# Patient Record
Sex: Female | Born: 1990 | Race: White | Hispanic: No | Marital: Married | Smoking: Never smoker
Health system: Southern US, Community
[De-identification: ages and names within clinical notes are randomized; demographics above are authoritative.]

## PROBLEM LIST (undated history)

## (undated) DIAGNOSIS — F419 Anxiety disorder, unspecified: Secondary | ICD-10-CM

## (undated) DIAGNOSIS — E282 Polycystic ovarian syndrome: Secondary | ICD-10-CM

## (undated) HISTORY — PX: NO PAST SURGERIES: SHX2092

## (undated) HISTORY — DX: Polycystic ovarian syndrome: E28.2

## (undated) HISTORY — DX: Anxiety disorder, unspecified: F41.9

---

## 2016-02-10 DIAGNOSIS — E669 Obesity, unspecified: Secondary | ICD-10-CM | POA: Insufficient documentation

## 2016-05-16 ENCOUNTER — Encounter: Payer: Self-pay | Admitting: Emergency Medicine

## 2016-05-16 ENCOUNTER — Emergency Department
Admission: EM | Admit: 2016-05-16 | Discharge: 2016-05-16 | Disposition: A | Discharge status: Home / Self Care | Payer: 59 | Source: Home / Self Care | Attending: Family Medicine | Admitting: Family Medicine

## 2016-05-16 DIAGNOSIS — J029 Acute pharyngitis, unspecified: Secondary | ICD-10-CM

## 2016-05-16 LAB — POCT RAPID STREP A (OFFICE): Rapid Strep A Screen: NEGATIVE

## 2016-05-16 NOTE — Discharge Instructions (Signed)
You may take 400-600mg  Ibuprofen (Motrin) every 6-8 hours for fever and pain  Alternate with Tylenol  You may take 500mg  Tylenol every 4-6 hours as needed for fever and pain  Follow-up with your primary care provider next week for recheck of symptoms if not improving.  Be sure to drink plenty of fluids and rest, at least 8hrs of sleep a night, preferably more while you are sick. Return urgent care or go to closest ER if you cannot keep down fluids/signs of dehydration, fever not reducing with Tylenol, difficulty breathing/wheezing, stiff neck, worsening condition, or other concerns (see below)   Your rapid strep test was Negative today, however, a more detailed test known as a culture has been sent to the lab for more testing. This takes about 24-48 hours.  If the culture comes back Positive, you will be notified and an antibiotic will be called in for you.   If you develop trouble breathing or swallowing liquids, please call 911 or go to closest emergency department.

## 2016-05-16 NOTE — ED Triage Notes (Signed)
Pt c/o sore throat and HA since yesterday. She has not taken any meds for this today. Afebrile.

## 2016-05-16 NOTE — ED Provider Notes (Signed)
CSN: [id number]     Arrival date & time 05/16/16  1726 History   First MD Initiated Contact with Patient 05/16/16 1801     Chief Complaint  Patient presents with  . Sore Throat   (Consider location/radiation/quality/duration/timing/severity/associated sxs/prior Treatment) HPI  Rachel Esparza is a 25 y.o. female presenting to UC with c/o 1-2 days of sore throat, generalized headache, and mild congestion with minimal cough.  She notes she just got off a flight earlier today which caused some bilateral ear pain/pressure. Denies fever, chills, n/v/d.     History reviewed. No pertinent past medical history. History reviewed. No pertinent surgical history. History reviewed. No pertinent family history. Social History  Substance Use Topics  . Smoking status: Never Smoker  . Smokeless tobacco: Never Used  . Alcohol use No   OB History    No data available     Review of Systems  Constitutional: Negative for chills and fever.  HENT: Positive for congestion and sore throat. Negative for ear pain, trouble swallowing and voice change.   Respiratory: Positive for cough ( minimal). Negative for shortness of breath.   Cardiovascular: Negative for chest pain and palpitations.  Gastrointestinal: Negative for abdominal pain, diarrhea, nausea and vomiting.  Musculoskeletal: Negative for arthralgias, back pain and myalgias.  Skin: Negative for rash.  Neurological: Positive for headaches. Negative for dizziness and light-headedness.    Allergies  Patient has no allergy information on record.  Home Medications   Prior to Admission medications   Not on File   Meds Ordered and Administered this Visit  Medications - No data to display  BP 144/83 (BP Location: Right Arm)   Pulse 108   Temp 98.2 F (36.8 C) (Oral)   Wt 198 lb (89.8 kg)   LMP 04/26/2016   SpO2 100%  No data found.   Physical Exam  Constitutional: She is oriented to person, place, and time. She appears well-developed  and well-nourished. No distress.  HENT:  Head: Normocephalic and atraumatic.  Right Ear: Tympanic membrane normal.  Left Ear: Tympanic membrane normal.  Nose: Nose normal.  Mouth/Throat: Uvula is midline and mucous membranes are normal. Posterior oropharyngeal edema and posterior oropharyngeal erythema present. No oropharyngeal exudate or tonsillar abscesses.  Eyes: EOM are normal.  Neck: Normal range of motion.  Cardiovascular: Normal rate and regular rhythm.   Pulmonary/Chest: Effort normal and breath sounds normal. No stridor. No respiratory distress. She has no wheezes. She has no rales.  Musculoskeletal: Normal range of motion.  Lymphadenopathy:    She has cervical adenopathy.  Neurological: She is alert and oriented to person, place, and time.  Skin: Skin is warm and dry. She is not diaphoretic.  Psychiatric: She has a normal mood and affect. Her behavior is normal.  Nursing note and vitals reviewed.   Urgent Care Course   Clinical Course     Procedures (including critical care time)  Labs Review Labs Reviewed  STREP A DNA PROBE  POCT RAPID STREP A (OFFICE)    Imaging Review No results found.    MDM   1. Viral pharyngitis    Pt c/o sore throat with headache minimal congestion and minimal cough.  Rapid strep: Negative Able to keep down fluids in UC.  Symptoms likely viral but will send culture. Acetaminophen, ibuprofen, Cepacol throat lozenges and salt water gargles. F/u in 7-10 days if not improving.     Junius Finnerrin O'Malley, PA-C 05/16/16 1815

## 2016-05-17 ENCOUNTER — Telehealth: Payer: Self-pay | Admitting: *Deleted

## 2016-05-17 LAB — STREP A DNA PROBE: GASP: NOT DETECTED

## 2016-05-17 NOTE — Telephone Encounter (Signed)
Callback: Patient advised of negative TCX result.

## 2016-05-18 ENCOUNTER — Telehealth: Payer: Self-pay | Admitting: Emergency Medicine

## 2016-05-18 NOTE — Telephone Encounter (Signed)
Throat culture neg, feeling better

## 2016-05-19 ENCOUNTER — Telehealth: Payer: Self-pay | Admitting: *Deleted

## 2016-05-19 NOTE — Telephone Encounter (Signed)
Pt called reports that her RT ear still feels "clogged", a little painful to touch, and echoing. Per dr Georgina Pillionmassey called flonase 1-2 sprays each nosrtil BID #1/0RF to [facility]. Pt notified.

## 2016-05-20 ENCOUNTER — Telehealth: Payer: Self-pay | Admitting: *Deleted

## 2016-05-20 MED ORDER — PREDNISONE 50 MG PO TABS: 50.0000 mg | ORAL_TABLET | Freq: Every day | ORAL | 0 refills | Status: DC

## 2016-05-20 MED ORDER — CEFDINIR 300 MG PO CAPS: 300.0000 mg | ORAL_CAPSULE | Freq: Two times a day (BID) | ORAL | 0 refills | Status: DC

## 2016-05-20 NOTE — Telephone Encounter (Signed)
Pt called back reports that after 2 doses of flonase her ear pressure is no better. She did not get the sudafed yet. She reports pain last night and states that her ear is red today. Per dr Georgina [facility]massey [facility]nis[facility]. She should still take flonase and sudafed. If she fails to improve by Monday she needs follow up with ENT. Pt agrees.

## 2017-07-02 DIAGNOSIS — E282 Polycystic ovarian syndrome: Secondary | ICD-10-CM | POA: Insufficient documentation

## 2017-11-09 DIAGNOSIS — H6981 Other specified disorders of Eustachian tube, right ear: Secondary | ICD-10-CM | POA: Insufficient documentation

## 2018-08-07 ENCOUNTER — Ambulatory Visit (INDEPENDENT_AMBULATORY_CARE_PROVIDER_SITE_OTHER): Payer: 59 | Admitting: Sports Medicine

## 2018-08-07 ENCOUNTER — Ambulatory Visit (INDEPENDENT_AMBULATORY_CARE_PROVIDER_SITE_OTHER): Payer: 59

## 2018-08-07 ENCOUNTER — Encounter: Payer: Self-pay | Admitting: Sports Medicine

## 2018-08-07 DIAGNOSIS — M5412 Radiculopathy, cervical region: Secondary | ICD-10-CM

## 2018-08-07 MED ORDER — PREDNISONE 50 MG PO TABS: ORAL_TABLET | ORAL | 0 refills | Status: DC

## 2018-08-07 MED ORDER — MELOXICAM 15 MG PO TABS: ORAL_TABLET | ORAL | 3 refills | Status: DC

## 2018-08-07 NOTE — Progress Notes (Signed)
Subjective:    CC: Left shoulder pain  HPI:  For the past few months this pleasant 28 year old female has had pain that she localizes over her left trapezius with radiation of the deltoid, she has started working out more recently.  On further questioning she has improvement in pain with elevation of the left shoulder, and occasionally gets paresthesias and numbness and tingling into both hands, left worse than right, often with prolonged downgaze such as looking at her cell phone or reading.  No progressive weakness, no constitutional symptoms, symptoms are moderate, persistent.  I reviewed the past medical history, family history, social history, surgical history, and allergies today and no changes were needed.  Please see the problem list section below in epic for further details.  Past Medical History: Past Medical History:  Diagnosis Date  . Anxiety   . PCOS (polycystic ovarian syndrome)    Past Surgical History: Past Surgical History:  Procedure Laterality Date  . NO PAST SURGERIES     Social History: Social History   Socioeconomic History  . Marital status: Married    Spouse name: Not on file  . Number of children: Not on file  . Years of education: Not on file  . Highest education level: Not on file  Occupational History  . Not on file  Social Needs  . Financial resource strain: Not on file  . Food insecurity:    Worry: Not on file    Inability: Not on file  . Transportation needs:    Medical: Not on file    Non-medical: Not on file  Tobacco Use  . Smoking status: Never Smoker  . Smokeless tobacco: Never Used  Substance and Sexual Activity  . Alcohol use: Yes  . Drug use: Not on file  . Sexual activity: Not on file  Lifestyle  . Physical activity:    Days per week: Not on file    Minutes per session: Not on file  . Stress: Not on file  Relationships  . Social connections:    Talks on phone: Not on file    Gets together: Not on file    Attends religious  service: Not on file    Active member of club or organization: Not on file    Attends meetings of clubs or organizations: Not on file    Relationship status: Not on file  Other Topics Concern  . Not on file  Social History Narrative  . Not on file   Family History: Family History  Problem Relation Age of Onset  . Cancer Maternal Aunt        colon, lung   Allergies: No Known Allergies Medications: See med rec.  Review of Systems: No headache, visual changes, nausea, vomiting, diarrhea, constipation, dizziness, abdominal pain, skin rash, fevers, chills, night sweats, swollen lymph nodes, weight loss, chest pain, body aches, joint swelling, muscle aches, shortness of breath, mood changes, visual or auditory hallucinations.  Objective:    General: Well Developed, well nourished, and in no acute distress.  Neuro: Alert and oriented x3, extra-ocular muscles intact, sensation grossly intact.  HEENT: Normocephalic, atraumatic, pupils equal round reactive to light, neck supple, no masses, no lymphadenopathy, thyroid nonpalpable.  Skin: Warm and dry, no rashes noted.  Cardiac: Regular rate and rhythm, no murmurs rubs or gallops.  Respiratory: Clear to auscultation bilaterally. Not using accessory muscles, speaking in full sentences.  Abdominal: Soft, nontender, nondistended, positive bowel sounds, no masses, no organomegaly.  Neck: Negative spurling's Full neck range  of motion Grip strength and sensation normal in bilateral hands Strength good C4 to T1 distribution No sensory change to C4 to T1 Reflexes normal Mildly positive Tinel sign on the left, negative on the right. Left shoulder: Inspection reveals no abnormalities, atrophy or asymmetry. Palpation is normal with no tenderness over AC joint or bicipital groove. ROM is full in all planes. Rotator cuff strength normal throughout. No signs of impingement with negative Neer and Hawkin's tests, empty can. Speeds and Yergason's  tests normal. No labral pathology noted with negative Obrien's, negative crank, negative clunk, and good stability. Normal scapular function observed. No painful arc and no drop arm sign. No apprehension sign  Impression and Recommendations:    The patient was counselled, risk factors were discussed, anticipatory guidance given.  Cervical radiculitis Unclear distribution, left shoulder pain better with elevation, numbness and tingling in both hands left worse than right. I do think she has cervical radiculitis, adding prednisone, meloxicam, x-rays, formal physical therapy. There is likely an element of carpal tunnel syndrome as well. Return to see me in 1 month, MRI if no better. ___________________________________________ Ihor Austin. Benjamin Stain, M.D., ABFM., CAQSM. Primary Care and Sports Medicine [facility]  Adjunct Professor of Family Medicine  [employer]

## 2018-08-07 NOTE — Assessment & Plan Note (Signed)
Unclear distribution, left shoulder pain better with elevation, numbness and tingling in both hands left worse than right. I do think she has cervical radiculitis, adding prednisone, meloxicam, x-rays, formal physical therapy. There is likely an element of carpal tunnel syndrome as well. Return to see me in 1 month, MRI if no better.

## 2018-08-16 ENCOUNTER — Ambulatory Visit: Payer: 59 | Admitting: Physical Therapy

## 2018-08-23 ENCOUNTER — Ambulatory Visit: Payer: 59 | Admitting: Physical Therapy

## 2018-09-17 ENCOUNTER — Ambulatory Visit: Payer: 59 | Admitting: Sports Medicine

## 2019-05-12 ENCOUNTER — Emergency Department
Admission: EM | Admit: 2019-05-12 | Discharge: 2019-05-12 | Disposition: A | Discharge status: Home / Self Care | Payer: 59 | Source: Home / Self Care | Attending: Family Medicine | Admitting: Family Medicine

## 2019-05-12 ENCOUNTER — Other Ambulatory Visit: Payer: Self-pay

## 2019-05-12 DIAGNOSIS — H5711 Ocular pain, right eye: Secondary | ICD-10-CM | POA: Diagnosis not present

## 2019-05-12 MED ORDER — NEOMYCIN-POLYMYXIN-DEXAMETH 3.5-10000-0.1 OP SUSP: OPHTHALMIC | 0 refills | Status: DC

## 2019-05-12 NOTE — ED Provider Notes (Signed)
[facility]    CSN: [id number] Arrival date & time: 05/12/19  0903      History   Chief Complaint Chief Complaint  Patient presents with  . Eye Pain    RT    HPI Rachel Esparza is a 28 y.o. female.   Patient complains of onset of right eye foreign body sensation last night after rubbing her eye.  She tried eye wash without relief.  The history is provided by the patient.  Eye Problem Location:  Right eye Quality:  Foreign body sensation Severity:  Mild Onset quality:  Sudden Duration:  1 day Timing:  Constant Progression:  Unchanged Chronicity:  New Context: not burn, not chemical exposure, not contact lens problem, not direct trauma, not foreign body, not using machinery, not scratch, not smoke exposure and not UV exposure   Relieved by:  Nothing Worsened by:  Eye movement Ineffective treatments:  Flushing Associated symptoms: inflammation, redness and tearing   Associated symptoms: no blurred vision, no crusting, no decreased vision, no discharge, no double vision, no itching, no photophobia, no scotomas and no swelling   Risk factors: no recent URI     Past Medical History:  Diagnosis Date  . Anxiety   . PCOS (polycystic ovarian syndrome)     Patient Active Problem List   Diagnosis Date Noted  . Cervical radiculitis 08/07/2018    Past Surgical History:  Procedure Laterality Date  . NO PAST SURGERIES      OB History   No obstetric history on file.      Home Medications    Prior to Admission medications   Medication Sig Start Date End Date Taking? Authorizing Provider  meloxicam (MOBIC) 15 MG tablet One tab PO qAM with breakfast for 2 weeks, then daily prn pain. 08/07/18   Monica Becton, MD  neomycin-polymyxin b-dexamethasone (MAXITROL) 3.5-10000-0.1 SUSP Place one gtt in right eye Q4h while awake for 3 days 05/12/19   Lattie Haw, MD  predniSONE (DELTASONE) 50 MG tablet One tab PO daily for 5 days. 08/07/18   Monica Becton, MD    Family History Family History  Problem Relation Age of Onset  . Cancer Maternal Aunt        colon, lung    Social History Social History   Tobacco Use  . Smoking status: Never Smoker  . Smokeless tobacco: Never Used  Substance Use Topics  . Alcohol use: Yes  . Drug use: Not on file     Allergies   Patient has no known allergies.   Review of Systems Review of Systems  Eyes: Positive for redness. Negative for blurred vision, double vision, photophobia, discharge and itching.  All other systems reviewed and are negative.    Physical Exam Triage Vital Signs ED Triage Vitals  Enc Vitals Group     BP 05/12/19 0920 118/81     Pulse Rate 05/12/19 0920 91     Resp 05/12/19 0920 18     Temp 05/12/19 0920 98.5 F (36.9 C)     Temp Source 05/12/19 0920 Oral     SpO2 05/12/19 0920 97 %     Weight 05/12/19 0921 198 lb (89.8 kg)     Height 05/12/19 0921 5\' 4"  (1.626 m)     Head Circumference --      Peak Flow --      Pain Score 05/12/19 0921 2     Pain Loc --      Pain  Edu? --      Excl. in [place]? --    No data found.  Updated Vital Signs BP 118/81 (BP Location: Left Arm)   Pulse 91   Temp 98.5 F (36.9 C) (Oral)   Resp 18   Ht 5\' 4"  (1.626 m)   Wt 89.8 kg   LMP  (LMP Unknown)   SpO2 97%   BMI 33.99 kg/m   Visual Acuity Right Eye Distance:   Left Eye Distance:   Bilateral Distance:    Right Eye Near:   Left Eye Near:    Bilateral Near:     Physical Exam Vitals and nursing note reviewed.  Constitutional:      General: She is not in acute distress. HENT:     Head: Normocephalic.     Right Ear: External ear normal.     Left Ear: External ear normal.     Nose: Nose normal.     Mouth/Throat:     Pharynx: Oropharynx is clear.  Eyes:     General: Lids are normal. Lids are everted, no foreign bodies appreciated.        Right eye: No foreign body.     Extraocular Movements: Extraocular movements intact.     Conjunctiva/sclera:      Right eye: Right conjunctiva is not injected. No chemosis or exudate.    Left eye: Left conjunctiva is not injected.     Pupils: Pupils are equal, round, and reactive to light.      Comments: Right conjunctivae mildly injected.  No fluorescein uptake.  Cardiovascular:     Rate and Rhythm: Normal rate.  Pulmonary:     Effort: Pulmonary effort is normal.  Lymphadenopathy:     Cervical: No cervical adenopathy.  Skin:    General: Skin is warm and dry.  Neurological:     Mental Status: She is alert.      UC Treatments / Results  Labs (all labs ordered are listed, but only abnormal results are displayed) Labs Reviewed - No data to display  EKG   Radiology No results found.  Procedures Procedures (including critical care time)  Medications Ordered in UC Medications - No data to display  Initial Impression / Assessment and Plan / UC Course  I have reviewed the triage vital signs and the nursing notes.  Pertinent labs & imaging results that were available during my care of the patient were reviewed by me and considered in my medical decision making (see chart for details).    No evidence foreign body.  ?resolving abrasion.  Begin Maxitrol gtts.  Followup with ophthalmologist if not improved 3 days.  Final Clinical Impressions(s) / UC Diagnoses   Final diagnoses:  Acute right eye pain   Discharge Instructions   None    ED Prescriptions    Medication Sig Dispense Auth. Provider   neomycin-polymyxin b-dexamethasone (MAXITROL) 3.5-10000-0.1 SUSP Place one gtt in right eye Q4h while awake for 3 days 5 mL Rachel Nicolas, MD        Rachel Nicolas, MD 05/17/19 [phone]

## 2019-05-12 NOTE — ED Triage Notes (Signed)
Pt c/o RT eye pain since last night. Says she rubbed it and may have had something in it which scratched her eye. Tried eye wash last night with little relief. Pain 2/10

## 2020-01-05 ENCOUNTER — Other Ambulatory Visit: Payer: Self-pay

## 2020-01-05 ENCOUNTER — Emergency Department
Admission: RE | Admit: 2020-01-05 | Discharge: 2020-01-05 | Disposition: A | Discharge status: Home / Self Care | Payer: 59 | Source: Ambulatory Visit | Attending: Family Medicine | Admitting: Family Medicine

## 2020-01-05 VITALS — BP 120/75 | HR 81 | Temp 98.7°F | Resp 18

## 2020-01-05 DIAGNOSIS — L0100 Impetigo, unspecified: Secondary | ICD-10-CM

## 2020-01-05 MED ORDER — PREDNISONE 20 MG PO TABS: ORAL_TABLET | ORAL | 0 refills | Status: DC

## 2020-01-05 MED ORDER — CEPHALEXIN 500 MG PO CAPS: ORAL_CAPSULE | ORAL | 0 refills | Status: DC

## 2020-01-05 MED ORDER — MUPIROCIN CALCIUM 2 % EX CREA: TOPICAL_CREAM | CUTANEOUS | 0 refills | Status: DC

## 2020-01-05 NOTE — ED Provider Notes (Signed)
[name]    CSN: [id number] Arrival date & time: 01/05/20  1246      History   Chief Complaint Chief Complaint  Patient presents with  . Appointment  . Rash    LT side of face    HPI Rachel Esparza is a 29 y.o. female.   Patient reports that she accidentally burned her left face in June with a curling iron and has had a persistent red spot at the area.  Recently she began to notice some clear yellowish drainage from the site and has been applying vaseline and an OTC antibiotic cream.  During the past two weeks she has noticed a gradually increasing rash at the site.  She states that she also has a history of rosacea which does not look like her present rash.  The history is provided by the patient.  Rash Location:  Face Facial rash location: left temporal area and left cheek. Quality: itchiness, redness and weeping   Quality: not blistering, not bruising, not burning, not draining, not painful, not peeling, not scaling and not swelling   Severity:  Mild Onset quality:  Gradual Timing:  Constant Progression:  Worsening Chronicity:  New Context: not animal contact, not chemical exposure, not exposure to similar rash, not food, not hot tub use, not insect bite/sting, not medications, not new detergent/soap, not nuts and not plant contact   Relieved by:  Nothing Worsened by:  Nothing Ineffective treatments:  Antibiotic cream Associated symptoms: no fatigue, no fever and no induration     Past Medical History:  Diagnosis Date  . Anxiety   . PCOS (polycystic ovarian syndrome)     Patient Active Problem List   Diagnosis Date Noted  . Cervical radiculitis 08/07/2018    Past Surgical History:  Procedure Laterality Date  . NO PAST SURGERIES      OB History   No obstetric history on file.      Home Medications    Prior to Admission medications   Medication Sig Start Date End Date Taking? Authorizing Provider  cephALEXin (KEFLEX) 500 MG capsule  Take one cap PO Q8hr 01/05/20   Lattie Haw, MD  meloxicam (MOBIC) 15 MG tablet One tab PO qAM with breakfast for 2 weeks, then daily prn pain. 08/07/18   Monica Becton, MD  mupirocin cream Idelle Jo) 2 % Apply to affected area 3 times daily 01/05/20 01/04/21  Lattie Haw, MD  neomycin-polymyxin b-dexamethasone (MAXITROL) 3.5-10000-0.1 SUSP Place one gtt in right eye Q4h while awake for 3 days 05/12/19   Lattie Haw, MD  predniSONE (DELTASONE) 20 MG tablet Take one tab by mouth twice daily for 3 days, then one daily for 2 days. Take with food. 01/05/20   Lattie Haw, MD    Family History Family History  Problem Relation Age of Onset  . Cancer Maternal Aunt        colon, lung    Social History Social History   Tobacco Use  . Smoking status: Never Smoker  . Smokeless tobacco: Never Used  Vaping Use  . Vaping Use: Never used  Substance Use Topics  . Alcohol use: Yes  . Drug use: Not on file     Allergies   Patient has no known allergies.   Review of Systems Review of Systems  Constitutional: Negative for chills, diaphoresis, fatigue and fever.  Skin: Positive for rash.  All other systems reviewed and are negative.    Physical Exam Triage Vital  Signs ED Triage Vitals  Enc Vitals Group     BP 01/05/20 1304 120/75     Pulse Rate 01/05/20 1304 81     Resp 01/05/20 1304 18     Temp 01/05/20 1304 98.7 F (37.1 C)     Temp Source 01/05/20 1304 Oral     SpO2 01/05/20 1304 99 %     Weight --      Height --      Head Circumference --      Peak Flow --      Pain Score 01/05/20 1305 0     Pain Loc --      Pain Edu? --      Excl. in [place]? --    No data found.  Updated Vital Signs BP 120/75 (BP Location: Left Arm)   Pulse 81   Temp 98.7 F (37.1 C) (Oral)   Resp 18   SpO2 99%   Visual Acuity Right Eye Distance:   Left Eye Distance:   Bilateral Distance:    Right Eye Near:   Left Eye Near:    Bilateral Near:     Physical Exam Vitals and  nursing note reviewed.  Constitutional:      General: She is not in acute distress. HENT:     Head: Normocephalic.      Comments: Left temporal area and left cheek have a patch of macular erythema without induration, fluctuance, or tenderness to palpation. The superior aspect of the lesion has a one cm diameter area of honey colored crust present.    Right Ear: External ear normal.     Left Ear: External ear normal.     Nose: Nose normal.     Mouth/Throat:     Pharynx: Oropharynx is clear.  Cardiovascular:     Rate and Rhythm: Normal rate.  Pulmonary:     Effort: Pulmonary effort is normal.  Musculoskeletal:     Cervical back: Neck supple.  Lymphadenopathy:     Cervical: No cervical adenopathy.  Skin:    General: Skin is warm and dry.  Neurological:     Mental Status: She is alert and oriented to person, place, and time.      UC Treatments / Results  Labs (all labs ordered are listed, but only abnormal results are displayed) Labs Reviewed  WOUND CULTURE    EKG   Radiology No results found.  Procedures Procedures (including critical care time)  Medications Ordered in UC Medications - No data to display  Initial Impression / Assessment and Plan / UC Course  I have reviewed the triage vital signs and the nursing notes.  Pertinent labs & imaging results that were available during my care of the patient were reviewed by me and considered in my medical decision making (see chart for details).    Wound culture pending.  Begin Keflex and Bactroban cream.  Prednisone burst/taper. Followup with dermatologist if not resolved within 8 days. Final Clinical Impressions(s) / UC Diagnoses   Final diagnoses:  Impetigo     Discharge Instructions      Wash your hands often with soap and warm water.  Do not share towels, washcloths, clothing, bedding, or razors.  Keep your fingernails short.    ED Prescriptions    Medication Sig Dispense Auth. Provider    cephALEXin (KEFLEX) 500 MG capsule Take one cap PO Q8hr 21 capsule Lattie Haw, MD   mupirocin cream (BACTROBAN) 2 % Apply to affected area 3 times daily  15 g Lattie Haw, MD   predniSONE (DELTASONE) 20 MG tablet Take one tab by mouth twice daily for 3 days, then one daily for 2 days. Take with food. 8 tablet Lattie Haw, MD        Lattie Haw, MD 01/06/20 [phone]

## 2020-01-05 NOTE — Discharge Instructions (Addendum)
Wash your hands often with soap and warm water. Do not share towels, washcloths, clothing, bedding, or razors. Keep your fingernails short.

## 2020-01-05 NOTE — ED Triage Notes (Signed)
Pt c/o rash on LT side of face. OTC antibiotic cream and vaseline tried. Hx of rosacea.

## 2020-01-07 LAB — WOUND CULTURE: MICRO NUMBER:: 10775086

## 2020-01-08 LAB — WOUND CULTURE: SPECIMEN QUALITY:: ADEQUATE

## 2020-01-17 ENCOUNTER — Telehealth: Payer: Self-pay | Admitting: Emergency Medicine

## 2020-01-17 NOTE — Telephone Encounter (Signed)
Call from [facility] regarding "bumps coming back" they had resolved but were now back. Wound culture from last visit was negative - patient updated. Per discharge instructions, Rachel Esparza should follow up with a dermatologist. Patient stated she had the # for [facility] [phone]

## 2020-04-17 ENCOUNTER — Other Ambulatory Visit: Payer: Self-pay

## 2020-04-17 ENCOUNTER — Emergency Department
Admission: RE | Admit: 2020-04-17 | Discharge: 2020-04-17 | Disposition: A | Discharge status: Home / Self Care | Payer: 59 | Source: Ambulatory Visit | Attending: Family Medicine | Admitting: Family Medicine

## 2020-04-17 VITALS — BP 122/84 | HR 106 | Temp 98.4°F | Resp 16 | Ht 64.0 in | Wt 219.0 lb

## 2020-04-17 DIAGNOSIS — R110 Nausea: Secondary | ICD-10-CM

## 2020-04-17 DIAGNOSIS — N915 Oligomenorrhea, unspecified: Secondary | ICD-10-CM | POA: Insufficient documentation

## 2020-04-17 DIAGNOSIS — R197 Diarrhea, unspecified: Secondary | ICD-10-CM

## 2020-04-17 DIAGNOSIS — L680 Hirsutism: Secondary | ICD-10-CM | POA: Insufficient documentation

## 2020-04-17 DIAGNOSIS — N920 Excessive and frequent menstruation with regular cycle: Secondary | ICD-10-CM | POA: Insufficient documentation

## 2020-04-17 LAB — POCT URINALYSIS DIP (MANUAL ENTRY)
Bilirubin, UA: NEGATIVE
Glucose, UA: NEGATIVE mg/dL
Ketones, POC UA: NEGATIVE mg/dL
Leukocytes, UA: NEGATIVE
Nitrite, UA: NEGATIVE
Spec Grav, UA: 1.025 (ref 1.010–1.025)
Urobilinogen, UA: 0.2 E.U./dL — AB
pH, UA: 5.5 (ref 5.0–8.0)

## 2020-04-17 LAB — POCT URINE PREGNANCY: Preg Test, Ur: NEGATIVE

## 2020-04-17 MED ORDER — ONDANSETRON 4 MG PO TBDP
4.0000 mg | ORAL_TABLET | ORAL | Status: AC
  Administered 2020-04-17: 4 mg via ORAL

## 2020-04-17 MED ORDER — ONDANSETRON 4 MG PO TBDP: 4.0000 mg | ORAL_TABLET | Freq: Three times a day (TID) | ORAL | 0 refills | Status: AC | PRN

## 2020-04-17 NOTE — ED Triage Notes (Signed)
Nausea & diarrhea w/ headache x 18 hours  Took aleve yesterday for HA Zofran given in triage  Husband had diarrhea 1 week ago

## 2020-04-17 NOTE — Discharge Instructions (Addendum)
Begin Pedialyte for about 12 to 18 hours until diarrhea stops, then switch to clear liquids (apple juice, clear grape juice, Jello, etc) for about 12 to 18 hours.  When improved, advance to a BRAT diet (Bananas, Rice, Applesauce, Toast). Then gradually resume a regular diet when tolerated.  Avoid milk products until well.     If symptoms become significantly worse during the night or over the weekend, proceed to the local emergency room.  

## 2020-04-17 NOTE — ED Provider Notes (Signed)
[name]    CSN: [id number] Arrival date & time: 04/17/20  1003      History   Chief Complaint Chief Complaint  Patient presents with  . Appointment  . Headache  . Nausea  . Diarrhea    HPI Rachel Esparza is a 29 y.o. female.   Patient awoke yesterday with nausea (no vomiting) and headache.  She subsequently developed watery diarrhea that has persisted.  She has been fatigued and had mild myalgias.  She states that her husband had diarrhea recently (resolved five days ago).  She denies recent foreign travel, or drinking untreated water in a wilderness environment.  She denies recent antibiotic use.   The history is provided by the patient.    Past Medical History:  Diagnosis Date  . Anxiety   . PCOS (polycystic ovarian syndrome)     Patient Active Problem List   Diagnosis Date Noted  . 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 Menorrhagia 04/17/2020  . Oligomenorrhea 04/17/2020  . Cervical radiculitis 08/07/2018  . Dysfunction of right eustachian tube 11/09/2017  . Polycystic ovaries 07/02/2017  . Obesity (BMI 30-39.9) 02/10/2016    Past Surgical History:  Procedure Laterality Date  . NO PAST SURGERIES      OB History   No obstetric history on file.      Home Medications    Prior to Admission medications   Medication Sig Start Date End Date Taking? Authorizing Provider  metFORMIN (GLUCOPHAGE) 500 MG tablet Take 500 mg by mouth 2 (two) times daily. 04/13/20  Yes [provider]  progesterone (PROMETRIUM) 100 MG capsule  01/31/20  Yes [provider]  cephALEXin (KEFLEX) 500 MG capsule Take one cap PO Q8hr Patient not taking: Reported on 04/17/2020 01/05/20   Lattie Haw, MD  meloxicam (MOBIC) 15 MG tablet One tab PO qAM with breakfast for 2 weeks, then daily prn pain. Patient not taking: Reported on 04/17/2020 08/07/18   Monica Becton, MD  mupirocin cream Idelle Jo) 2 % Apply to affected area 3 times daily Patient not  taking: Reported on 04/17/2020 01/05/20 01/04/21  Lattie Haw, MD  neomycin-polymyxin b-dexamethasone (MAXITROL) 3.5-10000-0.1 SUSP Place one gtt in right eye Q4h while awake for 3 days Patient not taking: Reported on 04/17/2020 05/12/19   Lattie Haw, MD  ondansetron (ZOFRAN ODT) 4 MG disintegrating tablet Take 1 tablet (4 mg total) by mouth every 8 (eight) hours as needed for up to 9 days for nausea or vomiting. Dissolve under tongue 04/17/20 04/26/20  Lattie Haw, MD  predniSONE (DELTASONE) 20 MG tablet Take one tab by mouth twice daily for 3 days, then one daily for 2 days. Take with food. Patient not taking: Reported on 04/17/2020 01/05/20   Lattie Haw, MD    Family History Family History  Problem Relation Age of Onset  . Cancer Maternal Aunt        colon, lung  . High Cholesterol Father   . Stroke Father   . Asthma Brother     Social History Social History   Tobacco Use  . Smoking status: Never Smoker  . Smokeless tobacco: Never Used  Vaping Use  . Vaping Use: Never used  Substance Use Topics  . Alcohol use: Yes  . Drug use: Never     Allergies   Patient has no known allergies.   Review of Systems Review of Systems  No sore throat No cough No pleuritic pain No wheezing No nasal congestion No  post-nasal drainage No sinus pain/pressure No itchy/red eyes No earache No hemoptysis No SOB No fever/chills, but felt hot yesterday + nausea No vomiting No abdominal pain + diarrhea No urinary symptoms No skin rash + fatigue ? myalgias + headache    Physical Exam Triage Vital Signs ED Triage Vitals  Enc Vitals Group     BP 04/17/20 1026 122/84     Pulse Rate 04/17/20 1026 (!) 106     Resp 04/17/20 1026 16     Temp 04/17/20 1026 98.4 F (36.9 C)     Temp Source 04/17/20 1026 Oral     SpO2 04/17/20 1026 99 %     Weight 04/17/20 1028 219 lb (99.3 kg)     Height 04/17/20 1028 5\' 4"  (1.626 m)     Head Circumference --      Peak Flow --       Pain Score 04/17/20 1027 4     Pain Loc --      Pain Edu? --      Excl. in GC? --    No data found.  Updated Vital Signs BP 122/84 (BP Location: Right Arm)   Pulse (!) 106   Temp 98.4 F (36.9 C) (Oral)   Resp 16   Ht 5\' 4"  (1.626 m)   Wt 99.3 kg   LMP 03/22/2020 (Exact Date)   SpO2 99%   BMI 37.59 kg/m   Visual Acuity Right Eye Distance:   Left Eye Distance:   Bilateral Distance:    Right Eye Near:   Left Eye Near:    Bilateral Near:     Physical Exam Nursing notes and Vital Signs reviewed. Appearance:  Patient appears stated age, and in no acute distress.    Eyes:  Pupils are equal, round, and reactive to light and accomodation.  Extraocular movement is intact.  Conjunctivae are not inflamed   Pharynx:  Normal; moist mucous membranes  Neck:  Supple.  No adenopathy Lungs:  Clear to auscultation.  Breath sounds are equal.  Moving air well. Heart:  Regular rate and rhythm without murmurs, rubs, or gallops.  Abdomen:  Nontender without masses or hepatosplenomegaly.  Bowel sounds are present.  No CVA or flank tenderness.  Extremities:  No edema.  Skin:  No rash present.     UC Treatments / Results  Labs (all labs ordered are listed, but only abnormal results are displayed) Labs Reviewed  POCT URINALYSIS DIP (MANUAL ENTRY) - Abnormal; Notable for the following components:      Result Value   Blood, UA trace-intact (*)    Protein Ur, POC trace (*)    Urobilinogen, UA 0.2 (*)    All other components within normal limits  POCT URINE PREGNANCY negative    EKG   Radiology No results found.  Procedures Procedures (including critical care time)  Medications Ordered in UC Medications  ondansetron (ZOFRAN-ODT) disintegrating tablet 4 mg (4 mg Oral Given 04/17/20 1021)    Initial Impression / Assessment and Plan / UC Course  I have reviewed the triage vital signs and the nursing notes.  Pertinent labs & imaging results that were available during my care of  the patient were reviewed by me and considered in my medical decision making (see chart for details).    Administered Zofran ODT 4mg  PO; given Rx for same. Benign exam. Suspect viral gastroenteritis (note that husband had diarrhea also). Treat symptomatically for now. Followup with Family Doctor if not improved in about 5 days.  Final Clinical Impressions(s) / UC Diagnoses   Final diagnoses:  Nausea without vomiting  Diarrhea, unspecified type     Discharge Instructions     Begin Pedialyte for about 12 to 18 hours until diarrhea stops, then switch to clear liquids (apple juice, clear grape juice, Jello, etc) for about 12 to 18 hours.  When improved, advance to a [employer] (Bananas, Rice, Applesauce, Toast). Then gradually resume a regular diet when tolerated.  Avoid milk products until well.    If symptoms become significantly worse during the night or over the weekend, proceed to the local emergency room.     ED Prescriptions    Medication Sig Dispense Auth. Provider   ondansetron (ZOFRAN ODT) 4 MG disintegrating tablet Take 1 tablet (4 mg total) by mouth every 8 (eight) hours as needed for up to 9 days for nausea or vomiting. Dissolve under tongue 12 tablet Lattie Haw, MD        Lattie Haw, MD 04/17/20 1236

## 2020-05-29 IMAGING — DX DG CERVICAL SPINE COMPLETE 4+V
7 series · 7 of 7 positions shown · non-contrast
Comparison: None.

CLINICAL DATA: Left-sided neck pain radiating to left shoulder for
3 months.

EXAM:
CERVICAL SPINE - COMPLETE 4+ VIEW

[c-spine lat]
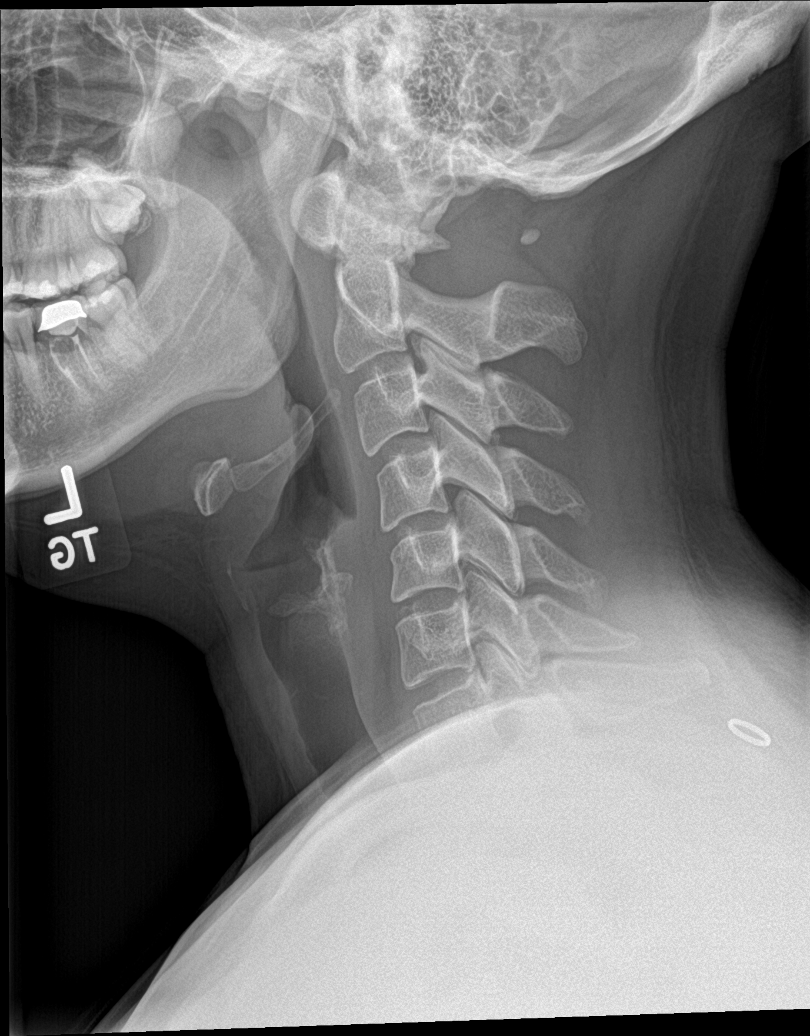

[c-spine obl (1 of 2)]
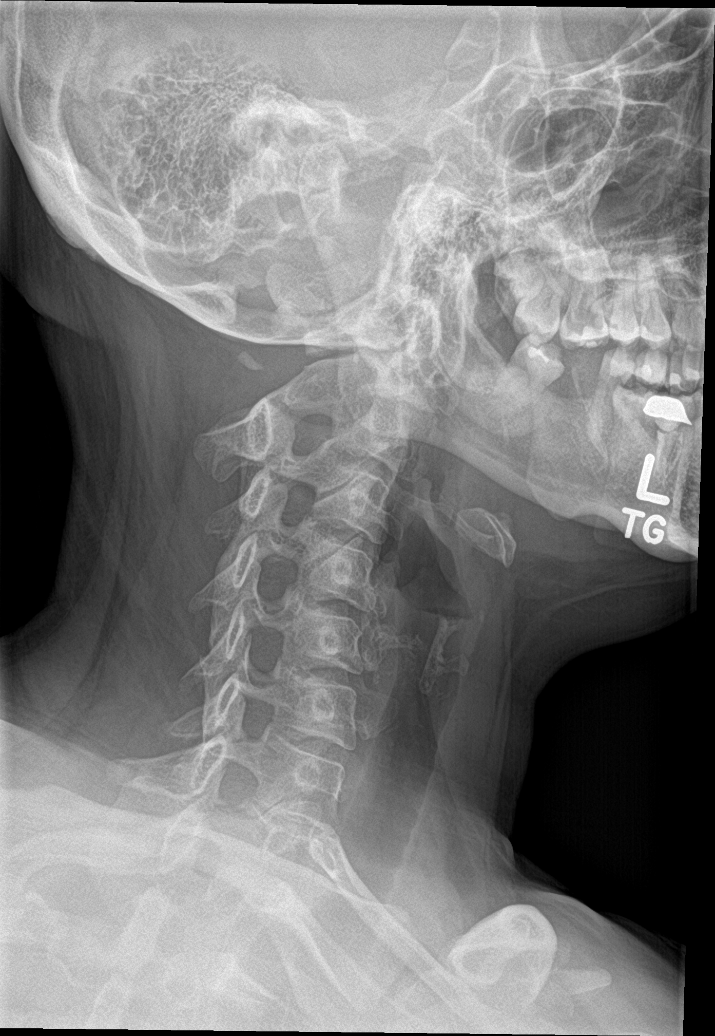

[c-spine obl (2 of 2)]
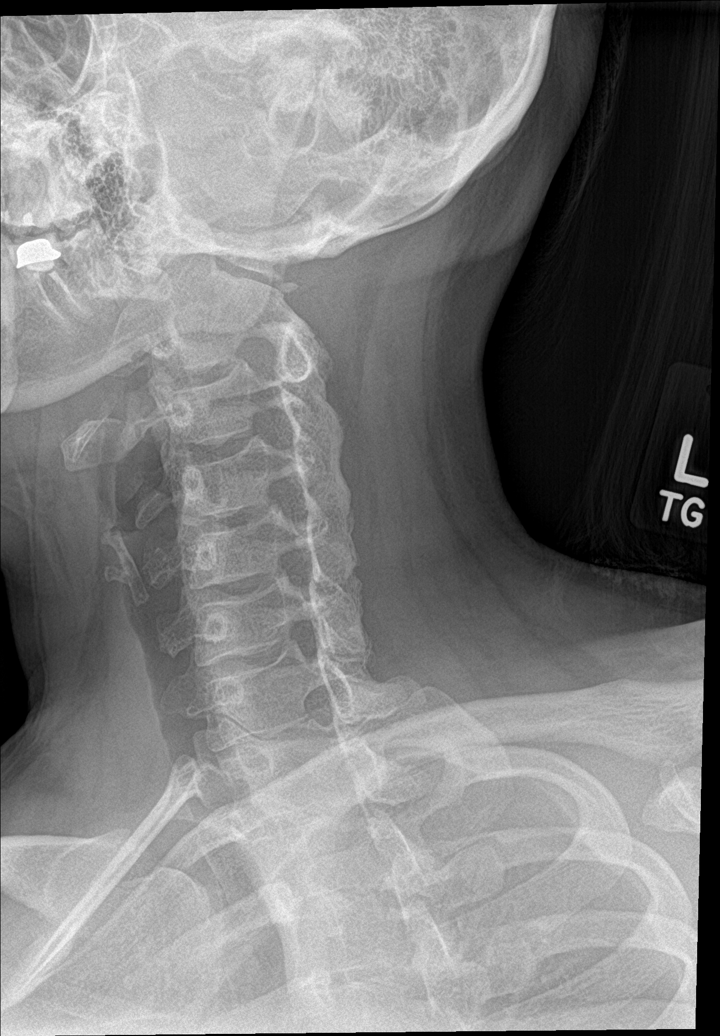

[c-spine ap]
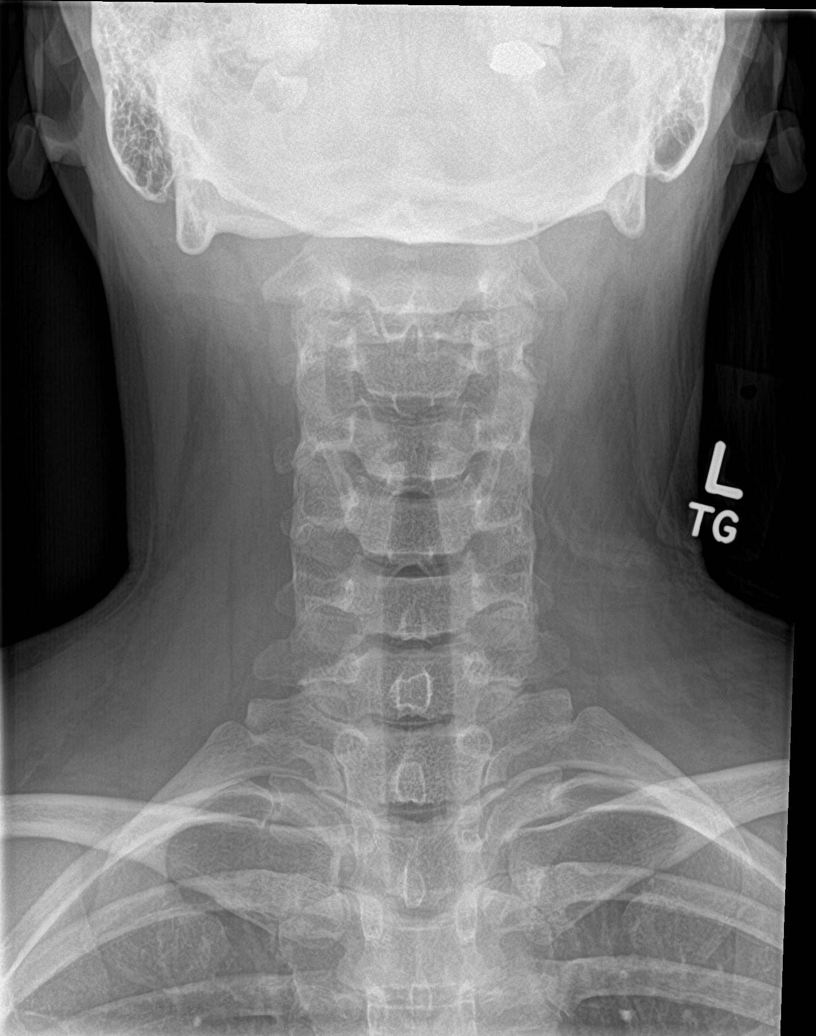

[c-spine open mouth (1 of 2)]
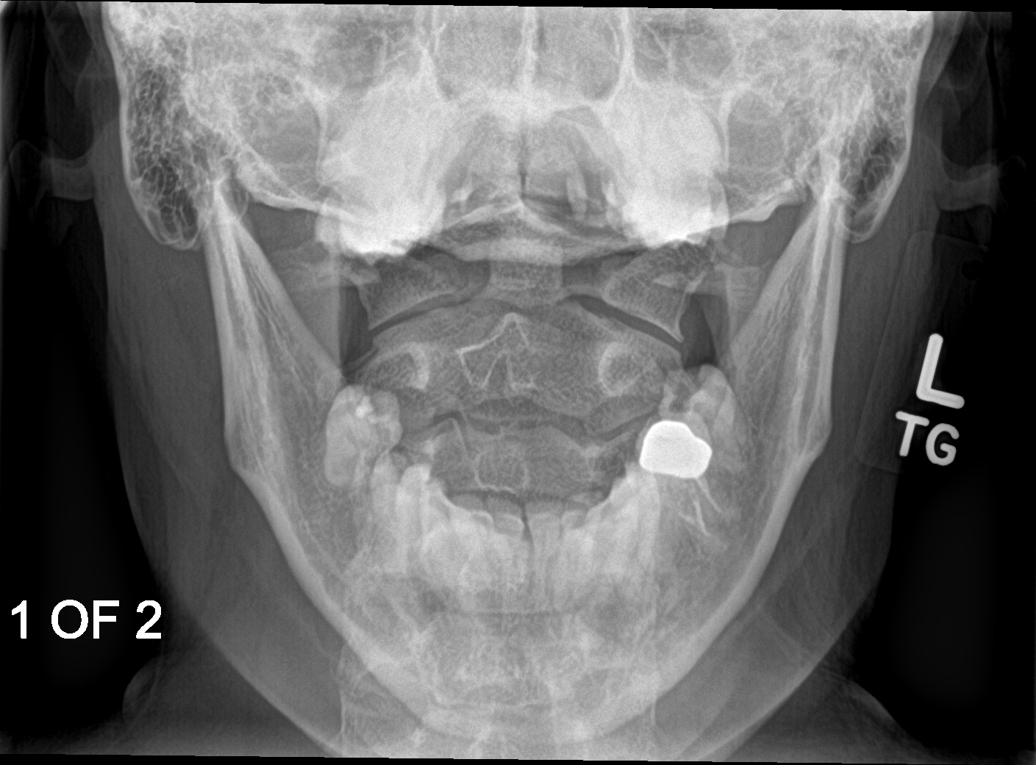

[c-spine swimmers]
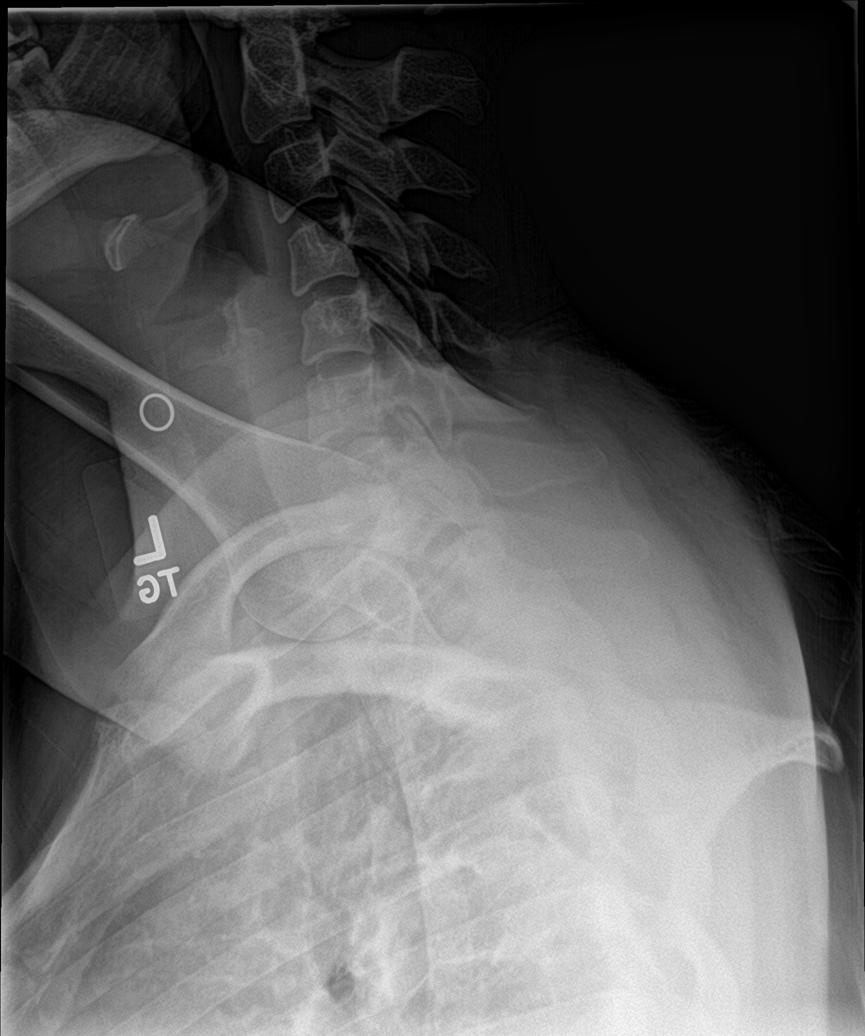

[c-spine open mouth (2 of 2)]
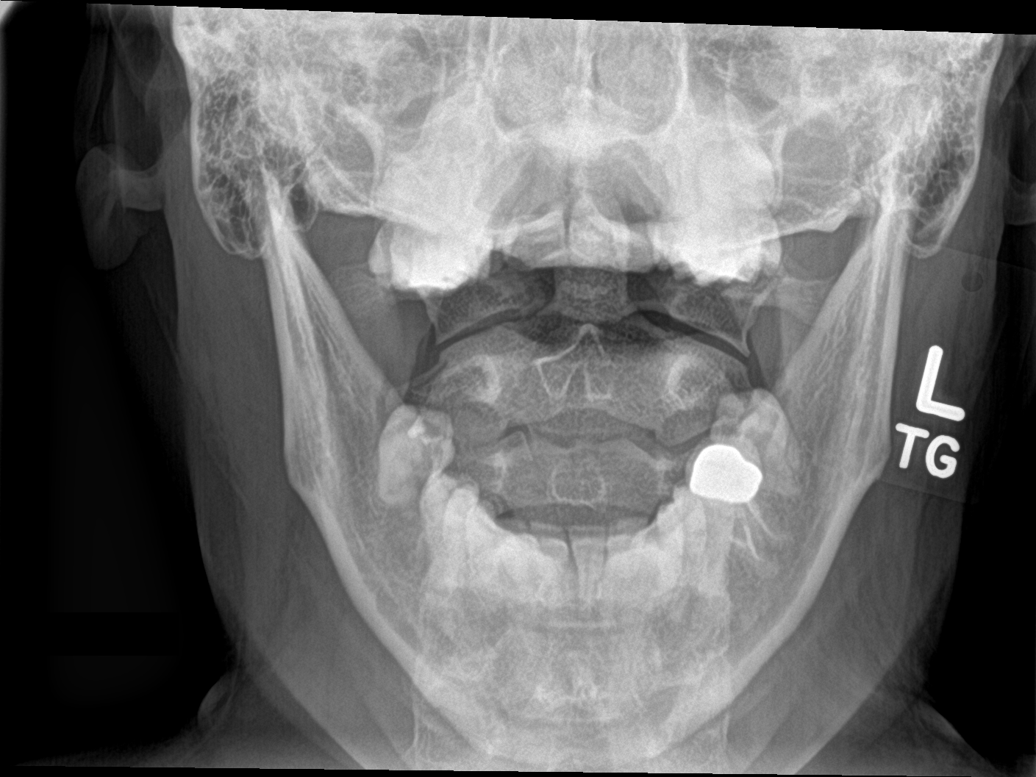

[7 of 7 positions shown; findings below may reference images not displayed]

FINDINGS: There is no evidence of cervical spine fracture or prevertebral soft
tissue swelling. Alignment is normal. Congenital hypoplasia of the
posterior arch of C1 noted, however atlantoaxial distance remains
normal. Mild cervical kyphosis is also noted.
IMPRESSION: 1. No acute findings.
2. Congenital hypoplasia of posterior arch of C1.
3. Mild cervical kyphosis.

## 2020-06-05 ENCOUNTER — Other Ambulatory Visit: Payer: Self-pay

## 2020-06-05 ENCOUNTER — Emergency Department
Admission: RE | Admit: 2020-06-05 | Discharge: 2020-06-05 | Disposition: A | Discharge status: Home / Self Care | Payer: 59 | Source: Ambulatory Visit

## 2020-06-05 VITALS — BP 120/78 | HR 84 | Temp 98.0°F | Resp 15 | Ht 64.0 in | Wt 215.0 lb

## 2020-06-05 DIAGNOSIS — J029 Acute pharyngitis, unspecified: Secondary | ICD-10-CM

## 2020-06-05 DIAGNOSIS — J069 Acute upper respiratory infection, unspecified: Secondary | ICD-10-CM

## 2020-06-05 NOTE — ED Triage Notes (Signed)
Nasal congestion since Wed  Woke up w/ sore throat yesterday  NO COVID or Flu vaccine  Wants to go to her niece's 1st birthday next Saturday and wants to be sure she is not contagious Nyquil & Dayquil OTC  Works from home

## 2020-06-05 NOTE — ED Provider Notes (Signed)
[name]    CSN: [id number] Arrival date & time: 06/05/20  1146      History   Chief Complaint Chief Complaint  Patient presents with  . Sore Throat  . Nasal Congestion    HPI Rachel Esparza is a 29 y.o. female.   HPI  Rachel Esparza is a 29 y.o. female presenting to UC with c/o nasal congestion, post-nasal drainage and sore throat since yesterday.  No known COVID exposure but states she plans to go to her niece's 1st birthday next Saturday and wants to make sure she is not contagious.  She is not vaccinated for COVID or flu. Denies fever, chills, n/v/d.    Past Medical History:  Diagnosis Date  . Anxiety   . PCOS (polycystic ovarian syndrome)     Patient Active Problem List   Diagnosis Date Noted  . 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 Menorrhagia 04/17/2020  . Oligomenorrhea 04/17/2020  . Cervical radiculitis 08/07/2018  . Dysfunction of right eustachian tube 11/09/2017  . Polycystic ovaries 07/02/2017  . Obesity (BMI 30-39.9) 02/10/2016    Past Surgical History:  Procedure Laterality Date  . NO PAST SURGERIES      OB History   No obstetric history on file.      Home Medications    Prior to Admission medications   Medication Sig Start Date End Date Taking? Authorizing Provider  metFORMIN (GLUCOPHAGE) 500 MG tablet Take 500 mg by mouth 2 (two) times daily. 04/13/20  Yes [provider]  progesterone (PROMETRIUM) 100 MG capsule  01/31/20  Yes [provider]    Family History Family History  Problem Relation Age of Onset  . Cancer Maternal Aunt        colon, lung  . High Cholesterol Father   . Stroke Father   . Asthma Brother     Social History Social History   Tobacco Use  . Smoking status: Never Smoker  . Smokeless tobacco: Never Used  Vaping Use  . Vaping Use: Never used  Substance Use Topics  . Alcohol use: Yes  . Drug use: Never     Allergies   Patient has no known allergies.   Review of  Systems Review of Systems  Constitutional: Negative for chills and fever.  HENT: Positive for congestion, postnasal drip and sore throat. Negative for ear pain, trouble swallowing and voice change.   Respiratory: Positive for cough. Negative for shortness of breath.   Cardiovascular: Negative for chest pain and palpitations.  Gastrointestinal: Negative for abdominal pain, diarrhea, nausea and vomiting.  Musculoskeletal: Negative for arthralgias, back pain and myalgias.  Skin: Negative for rash.  All other systems reviewed and are negative.    Physical Exam Triage Vital Signs ED Triage Vitals  Enc Vitals Group     BP 06/05/20 1223 120/78     Pulse Rate 06/05/20 1223 84     Resp 06/05/20 1223 15     Temp 06/05/20 1223 98 F (36.7 C)     Temp Source 06/05/20 1223 Oral     SpO2 06/05/20 1223 98 %     Weight 06/05/20 1224 215 lb (97.5 kg)     Height 06/05/20 1224 5\' 4"  (1.626 m)     Head Circumference --      Peak Flow --      Pain Score 06/05/20 1224 3     Pain Loc --      Pain Edu? --      Excl. in  GC? --    No data found.  Updated Vital Signs BP 120/78 (BP Location: Right Arm)   Pulse 84   Temp 98 F (36.7 C) (Oral)   Resp 15   Ht 5\' 4"  (1.626 m)   Wt 215 lb (97.5 kg)   LMP 05/15/2020 (Exact Date)   SpO2 98%   BMI 36.90 kg/m   Visual Acuity Right Eye Distance:   Left Eye Distance:   Bilateral Distance:    Right Eye Near:   Left Eye Near:    Bilateral Near:     Physical Exam Vitals and nursing note reviewed.  Constitutional:      General: She is not in acute distress.    Appearance: She is well-developed and well-nourished. She is not ill-appearing, toxic-appearing or diaphoretic.  HENT:     Head: Normocephalic and atraumatic.     Right Ear: Tympanic membrane and ear canal normal.     Left Ear: Tympanic membrane and ear canal normal.     Nose: Nose normal.     Right Sinus: No maxillary sinus tenderness or frontal sinus tenderness.     Left Sinus: No  maxillary sinus tenderness or frontal sinus tenderness.     Mouth/Throat:     Lips: Pink.     Mouth: Mucous membranes are moist.     Pharynx: Oropharynx is clear. Uvula midline. No pharyngeal swelling, oropharyngeal exudate, posterior oropharyngeal erythema or uvula swelling.  Eyes:     Extraocular Movements: EOM normal.  Cardiovascular:     Rate and Rhythm: Normal rate and regular rhythm.  Pulmonary:     Effort: Pulmonary effort is normal. No respiratory distress.     Breath sounds: Normal breath sounds. No stridor. No wheezing, rhonchi or rales.  Musculoskeletal:        General: Normal range of motion.     Cervical back: Normal range of motion and neck supple.  Lymphadenopathy:     Cervical: No cervical adenopathy.  Skin:    General: Skin is warm and dry.  Neurological:     Mental Status: She is alert and oriented to person, place, and time.  Psychiatric:        Mood and Affect: Mood and affect normal.        Behavior: Behavior normal.      UC Treatments / Results  Labs (all labs ordered are listed, but only abnormal results are displayed) Labs Reviewed  COVID-19, FLU A+B AND RSV    EKG   Radiology No results found.  Procedures Procedures (including critical care time)  Medications Ordered in UC Medications - No data to display  Initial Impression / Assessment and Plan / UC Course  I have reviewed the triage vital signs and the nursing notes.  Pertinent labs & imaging results that were available during my care of the patient were reviewed by me and considered in my medical decision making (see chart for details).     No evidence of bacterial infection at this time. COVID/Flu/RSV pending Encouraged symptomatic tx F/u with PCP as needed AVS given  Final Clinical Impressions(s) / UC Diagnoses   Final diagnoses:  Sore throat  Viral URI with cough     Discharge Instructions      You may take 500mg  acetaminophen every 4-6 hours or in combination with  ibuprofen 400-600mg  every 6-8 hours as needed for pain, inflammation, and fever.  Be sure to well hydrated with clear liquids and get at least 8 hours of sleep at  night, preferably more while sick.   Please follow up with family medicine in 1 week if needed.     ED Prescriptions    None     PDMP not reviewed this encounter.   Lurene Shadow, PA-C 06/05/20 1356

## 2020-06-05 NOTE — Discharge Instructions (Signed)
  You may take 500mg acetaminophen every 4-6 hours or in combination with ibuprofen 400-600mg every 6-8 hours as needed for pain, inflammation, and fever.  Be sure to well hydrated with clear liquids and get at least 8 hours of sleep at night, preferably more while sick.   Please follow up with family medicine in 1 week if needed.   

## 2020-06-08 LAB — COVID-19, FLU A+B AND RSV
Influenza A, NAA: NOT DETECTED
Influenza B, NAA: NOT DETECTED
RSV, NAA: NOT DETECTED
SARS-CoV-2, NAA: NOT DETECTED

## 2020-07-01 ENCOUNTER — Emergency Department
Admission: RE | Admit: 2020-07-01 | Discharge: 2020-07-01 | Disposition: A | Discharge status: Home / Self Care | Payer: 59 | Source: Ambulatory Visit

## 2020-07-01 ENCOUNTER — Ambulatory Visit: Payer: Self-pay

## 2020-07-01 ENCOUNTER — Other Ambulatory Visit: Payer: Self-pay

## 2020-07-01 VITALS — BP 131/87 | HR 110 | Temp 98.6°F | Resp 16

## 2020-07-01 DIAGNOSIS — R6889 Other general symptoms and signs: Secondary | ICD-10-CM

## 2020-07-01 DIAGNOSIS — R112 Nausea with vomiting, unspecified: Secondary | ICD-10-CM

## 2020-07-01 MED ORDER — ONDANSETRON 4 MG PO TBDP: 4.0000 mg | ORAL_TABLET | Freq: Three times a day (TID) | ORAL | 0 refills | Status: DC | PRN

## 2020-07-01 NOTE — ED Triage Notes (Signed)
Patient presents to Urgent Care with complaints of headache, nausea, and generalized body aches since 4 days ago. Patient does go to the gym and does not wear a mask.

## 2020-07-01 NOTE — Discharge Instructions (Signed)
  You may take 500mg acetaminophen every 4-6 hours or in combination with ibuprofen 400-600mg every 6-8 hours as needed for pain, inflammation, and fever.  Be sure to well hydrated with clear liquids and get at least 8 hours of sleep at night, preferably more while sick.   Please follow up with family medicine in 1 week if needed.   

## 2020-07-01 NOTE — ED Provider Notes (Addendum)
[name]    CSN: [id number] Arrival date & time: 07/01/20  1445      History   Chief Complaint Chief Complaint  Patient presents with  . Headache  . Nausea  . Generalized Body Aches    HPI Rachel Esparza is a 30 y.o. female.   HPI Rachel Esparza is a 30 y.o. female presenting to UC with c/o generalized HA, nausea, body aches and fatigue with mild congestion for 4 days. No known sick contacts but pt has not had the COVID vaccine and does work out in a gym without a mask.  She has not taken medication today due to not being able to eat anything until just PTA.      Past Medical History:  Diagnosis Date  . Anxiety   . PCOS (polycystic ovarian syndrome)     Patient Active Problem List   Diagnosis Date Noted  . 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 Menorrhagia 04/17/2020  . Oligomenorrhea 04/17/2020  . Cervical radiculitis 08/07/2018  . Dysfunction of right eustachian tube 11/09/2017  . Polycystic ovaries 07/02/2017  . Obesity (BMI 30-39.9) 02/10/2016    Past Surgical History:  Procedure Laterality Date  . NO PAST SURGERIES      OB History   No obstetric history on file.      Home Medications    Prior to Admission medications   Medication Sig Start Date End Date Taking? Authorizing Provider  ondansetron (ZOFRAN-ODT) 4 MG disintegrating tablet Take 1 tablet (4 mg total) by mouth every 8 (eight) hours as needed for nausea or vomiting. 07/01/20  Yes Doroteo Glassman, Marylynn Rigdon O, PA-C  metFORMIN (GLUCOPHAGE) 500 MG tablet Take 500 mg by mouth 2 (two) times daily. 04/13/20   [provider]  progesterone (PROMETRIUM) 100 MG capsule  01/31/20   [provider]    Family History Family History  Problem Relation Age of Onset  . Cancer Maternal Aunt        colon, lung  . High Cholesterol Father   . Stroke Father   . Asthma Brother     Social History Social History   Tobacco Use  . Smoking status: Never Smoker  . Smokeless tobacco: Never  Used  Vaping Use  . Vaping Use: Never used  Substance Use Topics  . Alcohol use: Yes    Comment: socially  . Drug use: Never     Allergies   Patient has no known allergies.   Review of Systems Review of Systems  Constitutional: Positive for fatigue. Negative for chills and fever.  HENT: Negative for congestion, ear pain, sore throat, trouble swallowing and voice change.   Respiratory: Negative for cough and shortness of breath.   Cardiovascular: Negative for chest pain and palpitations.  Gastrointestinal: Positive for nausea. Negative for abdominal pain, diarrhea and vomiting.  Musculoskeletal: Positive for arthralgias, back pain and myalgias.  Skin: Negative for rash.  Neurological: Positive for headaches. Negative for dizziness and light-headedness.  All other systems reviewed and are negative.    Physical Exam Triage Vital Signs ED Triage Vitals  Enc Vitals Group     BP 07/01/20 1458 131/87     Pulse Rate 07/01/20 1458 (!) 110     Resp 07/01/20 1458 16     Temp 07/01/20 1458 98.6 F (37 C)     Temp Source 07/01/20 1458 Oral     SpO2 07/01/20 1458 99 %     Weight --      Height --  Head Circumference --      Peak Flow --      Pain Score 07/01/20 1457 10     Pain Loc --      Pain Edu? --      Excl. in GC? --    No data found.  Updated Vital Signs BP 131/87 (BP Location: Right Arm)   Pulse (!) 110   Temp 98.6 F (37 C) (Oral)   Resp 16   SpO2 99%   Visual Acuity Right Eye Distance:   Left Eye Distance:   Bilateral Distance:    Right Eye Near:   Left Eye Near:    Bilateral Near:     Physical Exam Vitals and nursing note reviewed.  Constitutional:      General: She is not in acute distress.    Appearance: She is well-developed and well-nourished. She is not ill-appearing, toxic-appearing or diaphoretic.  HENT:     Head: Normocephalic and atraumatic.     Right Ear: Tympanic membrane and ear canal normal.     Left Ear: Tympanic membrane and  ear canal normal.     Nose: Nose normal.     Right Sinus: No maxillary sinus tenderness or frontal sinus tenderness.     Left Sinus: No maxillary sinus tenderness or frontal sinus tenderness.     Mouth/Throat:     Lips: Pink.     Mouth: Mucous membranes are moist.     Pharynx: Oropharynx is clear. Uvula midline. No pharyngeal swelling, oropharyngeal exudate, posterior oropharyngeal erythema or uvula swelling.  Eyes:     Extraocular Movements: EOM normal.  Cardiovascular:     Rate and Rhythm: Regular rhythm. Tachycardia present.  Pulmonary:     Effort: Pulmonary effort is normal. No respiratory distress.     Breath sounds: Normal breath sounds. No stridor. No wheezing, rhonchi or rales.  Musculoskeletal:        General: Normal range of motion.     Cervical back: Normal range of motion and neck supple.  Skin:    General: Skin is warm and dry.  Neurological:     Mental Status: She is alert and oriented to person, place, and time.     GCS: GCS eye subscore is 4. GCS verbal subscore is 5. GCS motor subscore is 6.  Psychiatric:        Mood and Affect: Mood and affect normal.        Behavior: Behavior normal.      UC Treatments / Results  Labs (all labs ordered are listed, but only abnormal results are displayed) Labs Reviewed  COVID-19, FLU A+B NAA    EKG   Radiology No results found.  Procedures Procedures (including critical care time)  Medications Ordered in UC Medications - No data to display  Initial Impression / Assessment and Plan / UC Course  I have reviewed the triage vital signs and the nursing notes.  Pertinent labs & imaging results that were available during my care of the patient were reviewed by me and considered in my medical decision making (see chart for details).     No evidence of bacterial infection at this time. COVID/Flu pending Encouraged symptomatic tx F/u with PCP as needed AVS given  Final Clinical Impressions(s) / UC Diagnoses    Final diagnoses:  Flu-like symptoms  Nausea and vomiting in adult patient     Discharge Instructions      You may take 500mg  acetaminophen every 4-6 hours or in combination with ibuprofen  400-600mg  every 6-8 hours as needed for pain, inflammation, and fever.  Be sure to well hydrated with clear liquids and get at least 8 hours of sleep at night, preferably more while sick.   Please follow up with family medicine in 1 week if needed.     ED Prescriptions    Medication Sig Dispense Auth. Provider   ondansetron (ZOFRAN-ODT) 4 MG disintegrating tablet Take 1 tablet (4 mg total) by mouth every 8 (eight) hours as needed for nausea or vomiting. 12 tablet Lurene Shadow, [place]     PDMP not reviewed this encounter.   Lurene Shadow, PA-C 07/01/20 1651    Lurene Shadow, PA-C 07/01/20 1651

## 2020-07-03 LAB — COVID-19, FLU A+B NAA
Influenza A, NAA: NOT DETECTED
Influenza B, NAA: NOT DETECTED
SARS-CoV-2, NAA: DETECTED — AB

## 2020-07-16 ENCOUNTER — Ambulatory Visit: Payer: Self-pay

## 2020-10-22 ENCOUNTER — Emergency Department (INDEPENDENT_AMBULATORY_CARE_PROVIDER_SITE_OTHER)
Admission: EM | Admit: 2020-10-22 | Discharge: 2020-10-22 | Disposition: A | Discharge status: Home / Self Care | Payer: 59 | Source: Home / Self Care

## 2020-10-22 ENCOUNTER — Other Ambulatory Visit: Payer: Self-pay

## 2020-10-22 ENCOUNTER — Emergency Department
Admission: RE | Admit: 2020-10-22 | Discharge: 2020-10-22 | Discharge status: Absent without leave | Payer: Self-pay | Source: Ambulatory Visit

## 2020-10-22 DIAGNOSIS — T22251S Burn of second degree of right shoulder, sequela: Secondary | ICD-10-CM

## 2020-10-22 DIAGNOSIS — L03113 Cellulitis of right upper limb: Secondary | ICD-10-CM | POA: Diagnosis not present

## 2020-10-22 MED ORDER — SILVER SULFADIAZINE 1 % EX CREA: 1.0000 "application " | TOPICAL_CREAM | Freq: Every day | CUTANEOUS | 0 refills | Status: DC

## 2020-10-22 MED ORDER — CEPHALEXIN 500 MG PO CAPS: 500.0000 mg | ORAL_CAPSULE | Freq: Three times a day (TID) | ORAL | 0 refills | Status: AC
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
## 2020-10-22 NOTE — Discharge Instructions (Signed)
Advised patient to take medication as directed with food to completion.  Encouraged patient to increase daily water intake while taking this medication.  Advised/encouraged patient may use Silvadene daily for the next 7 days.
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
## 2021-01-18 ENCOUNTER — Other Ambulatory Visit: Payer: Self-pay

## 2021-01-18 ENCOUNTER — Emergency Department
Admission: RE | Admit: 2021-01-18 | Discharge: 2021-01-18 | Disposition: A | Discharge status: Home / Self Care | Payer: 59 | Source: Ambulatory Visit | Attending: Family Medicine | Admitting: Family Medicine

## 2021-01-18 VITALS — BP 136/82 | HR 111 | Temp 98.8°F | Resp 17 | Ht 64.0 in | Wt 215.0 lb

## 2021-01-18 DIAGNOSIS — J029 Acute pharyngitis, unspecified: Secondary | ICD-10-CM | POA: Diagnosis not present

## 2021-01-18 LAB — POC SARS CORONAVIRUS 2 AG -  ED: SARS Coronavirus 2 Ag: NEGATIVE

## 2021-01-18 LAB — POCT RAPID STREP A (OFFICE): Rapid Strep A Screen: NEGATIVE

## 2021-01-18 NOTE — ED Triage Notes (Addendum)
Out of the country for 6 weeks in [place]  Returned  9 days ago  Sore throat x 1 week  Body aches x 3 days Denies fever or chills  No OTC meds COVID 04/2020 No COVID vaccine

## 2021-01-18 NOTE — ED Provider Notes (Signed)
[name]    CSN: [id number] Arrival date & time: 01/18/21  1057      History   Chief Complaint Chief Complaint  Patient presents with   Generalized Body Aches   Sore Throat    HPI Rachel Esparza is a 30 y.o. female.   HPI  Patient is here for sore throat.  Has had body aches for the last 3 days, sore throat started yesterday.  She has a painful throat.  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  Wants to be tested for strep and COVID.  She did recently travel to [place] for a family emergency.  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      Home Medications    Prior to Admission medications   Medication Sig Start Date End Date Taking? Authorizing Provider  Inositol-D Chiro-Inositol (OVASITOL PO) Take 4.5 g/day by mouth.   Yes [provider]  metFORMIN (GLUCOPHAGE) 500 MG tablet Take 1,500 mg by mouth 2 (two) times daily. 04/13/20   [provider]    Family History Family History  Problem Relation Age of Onset   Cancer Maternal Aunt        colon, lung   Hepatitis C Mother    High Cholesterol Father    Stroke Father    Emphysema Father    Heart disease Father    Asthma Brother     Social History Social History   Tobacco Use   Smoking status: Never   Smokeless tobacco: Never  Vaping Use   Vaping Use: Never used  Substance Use Topics   Alcohol use: Yes    Comment: socially   Drug use: Not  Currently     Allergies   Patient has no known allergies.   Review of Systems Review of Systems  See HPI Physical Exam Triage Vital Signs ED Triage Vitals  Enc Vitals Group     BP 01/18/21 1117 136/82     Pulse Rate 01/18/21 1117 (!) 111     Resp 01/18/21 1117 17     Temp 01/18/21 1117 98.8 F (37.1 C)     Temp Source 01/18/21 1117 Tympanic     SpO2 01/18/21 1117 99 %     Weight 01/18/21 1120 215 lb (97.5 kg)     Height 01/18/21 1120 5\' 4"  (1.626 m)     Head Circumference --      Peak Flow --      Pain Score 01/18/21 1119 3     Pain Loc --      Pain Edu? --      Excl. in GC? --    No data found.  Updated Vital Signs BP 136/82 (BP Location: Left Arm)   Pulse (!) 111   Temp 98.8 F (37.1 C) (Tympanic) Comment: pt drank cold water prior to check  Resp 17   Ht 5\' 4"  (1.626  m)   Wt 97.5 kg   LMP 01/06/2021 (Exact Date)   SpO2 99%   BMI 36.90 kg/m      Physical Exam Constitutional:      General: She is not in acute distress.    Appearance: She is well-developed.  HENT:     Head: Normocephalic and atraumatic.     Right Ear: Tympanic membrane, ear canal and external ear normal.     Left Ear: Tympanic membrane, ear canal and external ear normal.     Nose: Nose normal. No congestion.     Mouth/Throat:     Mouth: Mucous membranes are moist.     Pharynx: Posterior oropharyngeal erythema present.     Comments: Right tonsil is moderately larger than the left.  Both are mildly erythematous.  No exudate.  No ulcerations or lesion Eyes:     Conjunctiva/sclera: Conjunctivae normal.     Pupils: Pupils are equal, round, and reactive to light.  Cardiovascular:     Rate and Rhythm: Normal rate and regular rhythm.     Heart sounds: Normal heart sounds.  Pulmonary:     Effort: Pulmonary effort is normal. No respiratory distress.     Breath sounds: Normal breath sounds.  Abdominal:     General: There is no distension.     Palpations: Abdomen is soft.  Musculoskeletal:         General: Normal range of motion.     Cervical back: Normal range of motion.  Lymphadenopathy:     Cervical: No cervical adenopathy.  Skin:    General: Skin is warm and dry.  Neurological:     Mental Status: She is alert.  Psychiatric:        Mood and Affect: Mood normal.        Behavior: Behavior normal.     UC Treatments / Results  Labs (all labs ordered are listed, but only abnormal results are displayed) Labs Reviewed  POCT RAPID STREP A (OFFICE)  POC SARS CORONAVIRUS 2 AG -  ED    EKG   Radiology No results found.  Procedures Procedures (including critical care time)  Medications Ordered in UC Medications - No data to display  Initial Impression / Assessment and Plan / UC Course  I have reviewed the triage vital signs and the nursing notes.  Pertinent labs & imaging results that were available during my care of the patient were reviewed by me and considered in my medical decision making (see chart for details).     She has a negative strep and COVID testing.  This leaves viral pharyngitis is the likely diagnosis.  We will treat symptomatically Final Clinical Impressions(s) / UC Diagnoses   Final diagnoses:  Sore throat     Discharge Instructions      The strep test is negative, throat culture will be sent to the laboratory and will be available in 2 to 3 days.  You will be called if your throat culture is positive The COVID test is negative as well. You have a viral sore throat.  It is treated with Tylenol, Advil or Aleve, or salt water gargles.  It should improve over the next few days.  It is no more contagious than a cold.  You may return to work Wednesday if you feel better   ED Prescriptions   None    PDMP not reviewed this encounter.   Eustace Moore, MD 01/18/21 [phone]

## 2021-01-18 NOTE — Discharge Instructions (Addendum)
The strep test is negative, throat culture will be sent to the laboratory and will be available in 2 to 3 days.  You will be called if your throat culture is positive The COVID test is negative as well. You have a viral sore throat.  It is treated with Tylenol, Advil or Aleve, or salt water gargles.  It should improve over the next few days.  It is no more contagious than a cold.  You may return to work Wednesday if you feel better

## 2021-01-20 ENCOUNTER — Telehealth: Payer: Self-pay

## 2021-01-20 NOTE — Telephone Encounter (Signed)
Pt called requesting recent culture result, pt notified culture is still in process. Pt verbalized understanding.

## 2021-01-21 ENCOUNTER — Telehealth: Payer: Self-pay

## 2021-01-21 NOTE — Telephone Encounter (Signed)
Pt called requesting recent throat culture results. Pt notified that lab was still in process. This nurse offered to contact [facility] for an updated status. Per [facility] sample is still in process and a preliminary result should be available tomorrow as long as the test did not need to be repeated. Pt notified of updated status.

## 2021-01-22 ENCOUNTER — Telehealth (HOSPITAL_COMMUNITY): Payer: Self-pay | Admitting: Emergency Medicine

## 2021-01-22 LAB — CULTURE, GROUP A STREP

## 2021-01-22 MED ORDER — PENICILLIN V POTASSIUM 500 MG PO TABS: 500.0000 mg | ORAL_TABLET | Freq: Two times a day (BID) | ORAL | 0 refills | Status: AC

## 2021-02-27 ENCOUNTER — Emergency Department
Admission: RE | Admit: 2021-02-27 | Discharge: 2021-02-27 | Disposition: A | Discharge status: Home / Self Care | Payer: 59 | Source: Ambulatory Visit

## 2021-02-27 ENCOUNTER — Other Ambulatory Visit: Payer: Self-pay

## 2021-02-27 VITALS — BP 119/86 | HR 92 | Temp 98.8°F | Resp 18 | Ht 64.0 in | Wt 215.0 lb

## 2021-02-27 DIAGNOSIS — H6012 Cellulitis of left external ear: Secondary | ICD-10-CM

## 2021-02-27 MED ORDER — SULFAMETHOXAZOLE-TRIMETHOPRIM 800-160 MG PO TABS: 1.0000 | ORAL_TABLET | Freq: Two times a day (BID) | ORAL | 0 refills | Status: AC

## 2021-02-27 NOTE — ED Provider Notes (Signed)
[name]    CSN: [id number] Arrival date & time: 02/27/21  1252      History   Chief Complaint Chief Complaint  Patient presents with   Earring piercing drainage    HPI Rachel Esparza is a 30 y.o. female.   HPI 30 year old female presents with possible infection of left earring piercing site for 1 month.  Patient reports ear was initially pierced in April 2022.  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      Home Medications    Prior to Admission medications   Medication Sig Start Date End Date Taking? Authorizing Provider  sulfamethoxazole-trimethoprim (BACTRIM DS) 800-160 MG tablet Take 1 tablet by mouth 2 (two) times daily for 10 days. 02/27/21 03/09/21 Yes Trevor Iha, FNP  Inositol-D Chiro-Inositol (OVASITOL PO) Take 4.5 g/day by mouth.    [provider]  metFORMIN (GLUCOPHAGE) 500 MG tablet Take 1,500 mg by mouth 2 (two) times daily. 04/13/20   [provider]    Family History Family History  Problem Relation Age of Onset   Cancer Maternal Aunt        colon, lung   Hepatitis C Mother    High Cholesterol Father    Stroke Father    Emphysema Father    Heart disease Father    Asthma Brother     Social History Social History   Tobacco Use   Smoking status: Never   Smokeless tobacco: Never  Vaping Use   Vaping Use: Never used  Substance Use Topics   Alcohol use: Yes    Comment: socially   Drug use: Not Currently     Allergies   Patient has no  known allergies.   Review of Systems Review of Systems  Skin:  Positive for rash.  All other systems reviewed and are negative.   Physical Exam Triage Vital Signs ED Triage Vitals  Enc Vitals Group     BP 02/27/21 1350 119/86     Pulse Rate 02/27/21 1350 92     Resp 02/27/21 1350 18     Temp 02/27/21 1350 98.8 F (37.1 C)     Temp Source 02/27/21 1350 Oral     SpO2 02/27/21 1350 95 %     Weight 02/27/21 1347 215 lb (97.5 kg)     Height 02/27/21 1347 5\' 4"  (1.626 m)     Head Circumference --      Peak Flow --      Pain Score 02/27/21 1346 5     Pain Loc --      Pain Edu? --      Excl. in GC? --    No data found.  Updated Vital Signs BP 119/86 (BP Location: Right Arm)   Pulse 92   Temp 98.8 F (37.1 C) (Oral)   Resp 18   Ht 5\' 4"  (1.626 m)   Wt 215 lb (97.5 kg)   LMP 02/07/2021   SpO2 95%   BMI 36.90  kg/m    Physical Exam Vitals and nursing note reviewed.  Constitutional:      General: She is not in acute distress.    Appearance: Normal appearance. She is obese. She is ill-appearing.  HENT:     Head: Normocephalic and atraumatic.     Right Ear: Tympanic membrane, ear canal and external ear normal.     Left Ear: Tympanic membrane normal.     Ears:     Comments: Left external ear (superior helix/tragus): Erythematous, macerated, indurated, fluctuant, with mild soft tissue swelling noted.    Mouth/Throat:     Mouth: Mucous membranes are moist.     Pharynx: Oropharynx is clear.  Eyes:     Extraocular Movements: Extraocular movements intact.     Conjunctiva/sclera: Conjunctivae normal.     Pupils: Pupils are equal, round, and reactive to light.  Cardiovascular:     Rate and Rhythm: Normal rate and regular rhythm.     Pulses: Normal pulses.     Heart sounds: Normal heart sounds. No murmur heard. Pulmonary:     Effort: Pulmonary effort is normal.     Breath sounds: Normal breath sounds.     Comments: No adventitious breath sounds noted Musculoskeletal:         General: Normal range of motion.     Cervical back: Normal range of motion and neck supple. No tenderness.  Lymphadenopathy:     Cervical: No cervical adenopathy.  Skin:    General: Skin is warm and dry.  Neurological:     General: No focal deficit present.     Mental Status: She is alert and oriented to person, place, and time. Mental status is at baseline.  Psychiatric:        Mood and Affect: Mood normal.        Behavior: Behavior normal.        Thought Content: Thought content normal.     UC Treatments / Results  Labs (all labs ordered are listed, but only abnormal results are displayed) Labs Reviewed - No data to display  EKG   Radiology No results found.  Procedures Procedures (including critical care time)  Medications Ordered in UC Medications - No data to display  Initial Impression / Assessment and Plan / UC Course  I have reviewed the triage vital signs and the nursing notes.  Pertinent labs & imaging results that were available during my care of the patient were reviewed by me and considered in my medical decision making (see chart for details).     MDM: 1.  Cellulitis of helix of left ear-Rx'd Bactrim. Advised/instructed patient to take medication as directed with food to completion. Encouraged patient to increase daily water intake while taking this medication.  Advised/encouraged patient to leave affected area of left ear/left cheek open to air and do not cover with bandages.  Patient discharged home, hemodynamically stable. Final Clinical Impressions(s) / UC Diagnoses   Final diagnoses:  Cellulitis of helix of left ear     Discharge Instructions      Advised/instructed patient to take medication as directed with food to completion. Encouraged patient to increase daily water intake while taking this medication.  Advised/encouraged patient to leave affected area of left ear/left cheek open to air and do not cover with bandages.     ED  Prescriptions     Medication Sig Dispense Auth. Provider   sulfamethoxazole-trimethoprim (BACTRIM DS) 800-160 MG tablet Take 1 tablet by mouth 2 (two) times daily for 10  days. 20 tablet Trevor Iha, FNP      PDMP not reviewed this encounter.   Trevor Iha, FNP 02/27/21 1428

## 2021-02-27 NOTE — ED Triage Notes (Signed)
Pt presents to Urgent Care with c/o drainage/pus coming from L earring piercing site x 1 month. Reports ear was pierced in April 2022. Redness and drainage noted.

## 2021-02-27 NOTE — Discharge Instructions (Addendum)
Advised/instructed patient to take medication as directed with food to completion. Encouraged patient to increase daily water intake while taking this medication.  Advised/encouraged patient to leave affected area of left ear/left cheek open to air and do not cover with bandages.

## 2021-02-28 ENCOUNTER — Ambulatory Visit: Payer: Self-pay

## 2021-04-22 ENCOUNTER — Ambulatory Visit: Payer: 59 | Admitting: Medical-Surgical

## 2021-05-13 ENCOUNTER — Other Ambulatory Visit: Payer: Self-pay

## 2021-05-13 ENCOUNTER — Emergency Department
Admission: RE | Admit: 2021-05-13 | Discharge: 2021-05-13 | Disposition: A | Discharge status: Home / Self Care | Payer: 59 | Source: Ambulatory Visit

## 2021-05-13 DIAGNOSIS — R0981 Nasal congestion: Secondary | ICD-10-CM

## 2021-05-13 DIAGNOSIS — R059 Cough, unspecified: Secondary | ICD-10-CM | POA: Diagnosis not present

## 2021-05-13 LAB — POC INFLUENZA A AND B ANTIGEN (URGENT CARE ONLY)
Influenza A Ag: NEGATIVE
Influenza B Ag: NEGATIVE

## 2021-05-13 LAB — POC SARS CORONAVIRUS 2 AG -  ED: SARS Coronavirus 2 Ag: NEGATIVE

## 2021-05-13 NOTE — ED Triage Notes (Signed)
Pt presents with cough and congestion x 3 days, pt denies fever body aches chills. Pt states she had strep three weeks ago

## 2021-06-10 ENCOUNTER — Inpatient Hospital Stay: Admission: RE | Admit: 2021-06-10 | Payer: Self-pay | Source: Ambulatory Visit

## 2023-06-27 ENCOUNTER — Ambulatory Visit: Payer: Self-pay
